# Patient Record
Sex: Male | Born: 1995 | Race: White | Hispanic: No | Marital: Single | Smoking: Never smoker
Health system: Southern US, Community
[De-identification: ages and names within clinical notes are randomized; demographics above are authoritative.]

## PROBLEM LIST (undated history)

## (undated) DIAGNOSIS — F909 Attention-deficit hyperactivity disorder, unspecified type: Secondary | ICD-10-CM

---
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
## 2015-01-18 ENCOUNTER — Emergency Department (HOSPITAL_COMMUNITY): Payer: BLUE CROSS/BLUE SHIELD

## 2015-01-18 ENCOUNTER — Encounter (HOSPITAL_COMMUNITY): Payer: Self-pay | Admitting: *Deleted

## 2015-01-18 ENCOUNTER — Emergency Department (HOSPITAL_COMMUNITY)
Admission: EM | Admit: 2015-01-18 | Discharge: 2015-01-18 | Disposition: A | Discharge status: Home / Self Care | Payer: BLUE CROSS/BLUE SHIELD | Attending: Emergency Medicine | Admitting: Emergency Medicine

## 2015-01-18 DIAGNOSIS — L02415 Cutaneous abscess of right lower limb: Secondary | ICD-10-CM | POA: Diagnosis present

## 2015-01-18 DIAGNOSIS — Z8659 Personal history of other mental and behavioral disorders: Secondary | ICD-10-CM | POA: Diagnosis not present

## 2015-01-18 HISTORY — DX: Attention-deficit hyperactivity disorder, unspecified type: F90.9

## 2015-01-18 LAB — CBC WITH DIFFERENTIAL/PLATELET
Basophils Absolute: 0 10*3/uL (ref 0.0–0.1)
Basophils Relative: 0 % (ref 0–1)
EOS ABS: 0.1 10*3/uL (ref 0.0–0.7)
Eosinophils Relative: 1 % (ref 0–5)
HCT: 44.8 % (ref 39.0–52.0)
HEMOGLOBIN: 15.8 g/dL (ref 13.0–17.0)
LYMPHS ABS: 1.7 10*3/uL (ref 0.7–4.0)
LYMPHS PCT: 16 % (ref 12–46)
MCH: 30.3 pg (ref 26.0–34.0)
MCHC: 35.3 g/dL (ref 30.0–36.0)
MCV: 86 fL (ref 78.0–100.0)
Monocytes Absolute: 1.3 10*3/uL — ABNORMAL HIGH (ref 0.1–1.0)
Monocytes Relative: 12 % (ref 3–12)
NEUTROS ABS: 7.4 10*3/uL (ref 1.7–7.7)
NEUTROS PCT: 71 % (ref 43–77)
PLATELETS: 264 10*3/uL (ref 150–400)
RBC: 5.21 MIL/uL (ref 4.22–5.81)
RDW: 12.7 % (ref 11.5–15.5)
WBC: 10.4 10*3/uL (ref 4.0–10.5)

## 2015-01-18 LAB — BASIC METABOLIC PANEL
Anion gap: 8 (ref 5–15)
BUN: 10 mg/dL (ref 6–23)
CO2: 28 mmol/L (ref 19–32)
Calcium: 9.5 mg/dL (ref 8.4–10.5)
Chloride: 103 mmol/L (ref 96–112)
Creatinine, Ser: 0.82 mg/dL (ref 0.50–1.35)
GFR calc non Af Amer: >90 mL/min (ref >90)
GLUCOSE: 90 mg/dL (ref 70–99)
Potassium: 3.6 mmol/L (ref 3.5–5.1)
Sodium: 139 mmol/L (ref 135–145)

## 2015-01-18 MED ORDER — VANCOMYCIN HCL IN DEXTROSE 1-5 GM/200ML-% IV SOLN
1000.0000 mg | Freq: Once | INTRAVENOUS | Status: AC
  Administered 2015-01-18: 1000 mg via INTRAVENOUS
  Filled 2015-01-18: qty 200

## 2015-01-18 MED ORDER — OXYCODONE-ACETAMINOPHEN 5-325 MG PO TABS
1.0000 | ORAL_TABLET | Freq: Once | ORAL | Status: AC
Start: 2015-01-18 — End: 2015-01-18
  Administered 2015-01-18: 1 via ORAL
  Filled 2015-01-18: qty 1

## 2015-01-18 MED ORDER — LIDOCAINE HCL (PF) 2 % IJ SOLN
10.0000 mL | Freq: Once | INTRAMUSCULAR | Status: AC
  Administered 2015-01-18: 10 mL
  Filled 2015-01-18: qty 10

## 2015-01-18 MED ORDER — IBUPROFEN 800 MG PO TABS: 800.0000 mg | ORAL_TABLET | Freq: Three times a day (TID) | ORAL | Status: AC

## 2015-01-18 MED ORDER — DOXYCYCLINE HYCLATE 100 MG PO CAPS: 100.0000 mg | ORAL_CAPSULE | Freq: Two times a day (BID) | ORAL | Status: AC

## 2015-01-18 MED ORDER — KETOROLAC TROMETHAMINE 10 MG PO TABS
10.0000 mg | ORAL_TABLET | Freq: Once | ORAL | Status: AC
  Administered 2015-01-18: 10 mg via ORAL
  Filled 2015-01-18: qty 1

## 2015-01-18 MED ORDER — PROMETHAZINE HCL 12.5 MG PO TABS
12.5000 mg | ORAL_TABLET | Freq: Once | ORAL | Status: AC
  Administered 2015-01-18: 12.5 mg via ORAL
  Filled 2015-01-18: qty 1

## 2015-01-18 MED ORDER — OXYCODONE-ACETAMINOPHEN 5-325 MG PO TABS: 1.0000 | ORAL_TABLET | Freq: Four times a day (QID) | ORAL | Status: AC | PRN

## 2015-01-18 NOTE — ED Notes (Signed)
Raised red area to rt knee, Pt has it had a "white head and I popped it and pus came out.".

## 2015-01-18 NOTE — ED Notes (Signed)
Iv vancomycin in, Pt called and IV site red, and uncomfortable.  Iv removed.

## 2015-01-18 NOTE — ED Notes (Signed)
Decreased redness at site of IV, Loney LaurenceH Bryant PA had been made aware .  Knee bandaged, and pt ambulated out of ED

## 2015-01-18 NOTE — Discharge Instructions (Signed)
Starting tomorrow, please change her bandage daily until the wound is healed from the inside out. Please soak in a tub of warm Epsom salt water for about 15 minutes daily until the wound is healed. Please take doxycycline 2 times daily with food until all taken. Please use ibuprofen 3 times daily with a meal. May use Percocet every 6 hours if needed for pain. This medication may cause drowsiness, and or constipation. Please use with caution. Abscess Care After An abscess (also called a boil or furuncle) is an infected area that contains a collection of pus. Signs and symptoms of an abscess include pain, tenderness, redness, or hardness, or you may feel a moveable soft area under your skin. An abscess can occur anywhere in the body. The infection may spread to surrounding tissues causing cellulitis. A cut (incision) by the surgeon was made over your abscess and the pus was drained out. Gauze may have been packed into the space to provide a drain that will allow the cavity to heal from the inside outwards. The boil may be painful for 5 to 7 days. Most people with a boil do not have high fevers. Your abscess, if seen early, may not have localized, and may not have been lanced. If not, another appointment may be required for this if it does not get better on its own or with medications. HOME CARE INSTRUCTIONS   Only take over-the-counter or prescription medicines for pain, discomfort, or fever as directed by your caregiver.  When you bathe, soak and then remove gauze or iodoform packs at least daily or as directed by your caregiver. You may then wash the wound gently with mild soapy water. Repack with gauze or do as your caregiver directs. SEEK IMMEDIATE MEDICAL CARE IF:   You develop increased pain, swelling, redness, drainage, or bleeding in the wound site.  You develop signs of generalized infection including muscle aches, chills, fever, or a general ill feeling.  An oral temperature above 102 F  (38.9 C) develops, not controlled by medication. See your caregiver for a recheck if you develop any of the symptoms described above. If medications (antibiotics) were prescribed, take them as directed. Document Released: 05/14/2005 Document Revised: 01/18/2012 Document Reviewed: 01/09/2008 [facility] Patient Information 2015 [employer] [place]. This information is not intended to replace advice given to you by your health care provider. Make sure you discuss any questions you have with your health care provider.

## 2015-01-18 NOTE — ED Provider Notes (Signed)
CSN: [id number]     Arrival date & time 01/18/15  0944 History   First MD Initiated Contact with Patient 01/18/15 1152     Chief Complaint  Patient presents with  . Abscess     (Consider location/radiation/quality/duration/timing/severity/associated sxs/prior Treatment) Patient is a 19 y.o. male presenting with abscess. The history is provided by the patient.  Abscess Location:  Leg Leg abscess location:  R knee Abscess quality: draining, painful, redness and warmth   Duration:  4 days Progression:  Worsening Pain details:    Quality:  Pressure and aching   Severity:  Moderate   Timing:  Intermittent   Progression:  Worsening Chronicity:  New Context: not diabetes and not immunosuppression   Relieved by:  Nothing Worsened by:  Nothing tried Ineffective treatments:  None tried Associated symptoms: fever   Associated symptoms: no fatigue and no nausea   Associated symptoms comment:  Pain with standing and walking   Past Medical History  Diagnosis Date  . ADHD (attention deficit hyperactivity disorder)    History reviewed. No pertinent past surgical history. No family history on file. History  Substance Use Topics  . Smoking status: Never Smoker   . Smokeless tobacco: Not on file  . Alcohol Use: No    Review of Systems  Constitutional: Positive for fever. Negative for fatigue.  Gastrointestinal: Negative for nausea.  Musculoskeletal: Positive for arthralgias.  All other systems reviewed and are negative.     Allergies  Review of patient's allergies indicates no known allergies.  Home Medications   Prior to Admission medications   Not on File   BP 125/65 mmHg  Pulse 94  Temp(Src) 99 F (37.2 C) (Oral)  Resp 16  Ht 5\' 4"  (1.626 m)  Wt 135 lb (61.236 kg)  BMI 23.16 kg/m2  SpO2 100% Physical Exam  Constitutional: He is oriented to person, place, and time. He appears well-developed and well-nourished.  Non-toxic appearance.  HENT:  Head:  Normocephalic.  Right Ear: Tympanic membrane and external ear normal.  Left Ear: Tympanic membrane and external ear normal.  Eyes: EOM and lids are normal. Pupils are equal, round, and reactive to light.  Neck: Normal range of motion. Neck supple. Carotid bruit is not present.  Cardiovascular: Normal rate, regular rhythm, normal heart sounds, intact distal pulses and normal pulses.   Pulmonary/Chest: Breath sounds normal. No respiratory distress.  Abdominal: Soft. Bowel sounds are normal. There is no tenderness. There is no guarding.  Musculoskeletal: Normal range of motion.  Lymphadenopathy:       Head (right side): No submandibular adenopathy present.       Head (left side): No submandibular adenopathy present.    He has no cervical adenopathy.  Neurological: He is alert and oriented to person, place, and time. He has normal strength. No cranial nerve deficit or sensory deficit.  Skin: Skin is warm and dry.  Psychiatric: He has a normal mood and affect. His speech is normal.  Nursing note and vitals reviewed.       ED Course  INCISION AND DRAINAGE Date/Time: 01/18/2015 3:27 PM Performed by: Ivery QualeBRYANT, Audi Conover Authorized by: Ivery QualeBRYANT, Wilhelmenia Addis Consent: Verbal consent obtained. Risks and benefits: risks, benefits and alternatives were discussed Consent given by: patient Patient understanding: patient states understanding of the procedure being performed Patient identity confirmed: arm band Time out: Immediately prior to procedure a "time out" was called to verify the correct patient, procedure, equipment, support staff and site/side marked as required. Type: abscess Body area: lower  extremity (right knee) Anesthesia: local infiltration Local anesthetic: lidocaine 2% without epinephrine Patient sedated: no Scalpel size: 11 Incision type: single straight Complexity: simple Drainage: purulent Drainage amount: moderate Wound treatment: wound left open Patient tolerance: Patient  tolerated the procedure well with no immediate complications   (including critical care time) Labs Review Labs Reviewed - No data to display  Imaging Review No results found.   EKG Interpretation None      MDM  Vital signs stable. PUlse ox 100% on room air. WNL by my interpretation. CBC and Bmet wnl.  Xray neg for gas or osteomyelitis I and D carried out and culture sent to the lab. Pt to return if any signs of infection.  Rx for doxycycline and percocet given to the patient.   Final diagnoses:  None    **I have reviewed nursing notes, vital signs, and all appropriate lab and imaging results for this patient.Ivery Quale, PA-C 01/18/15 1530  Samuel Jester, DO 01/20/15 [phone]

## 2015-01-18 NOTE — ED Notes (Signed)
Pt states abscess to right knee x 4 days.

## 2015-01-21 ENCOUNTER — Telehealth (HOSPITAL_COMMUNITY): Payer: Self-pay

## 2015-01-21 LAB — CULTURE, ROUTINE-ABSCESS

## 2015-01-21 NOTE — ED Notes (Signed)
Lab called with positive wound culture- mrsa. Treated per protocol. Attempting to contact pt.

## 2015-01-22 ENCOUNTER — Telehealth (HOSPITAL_BASED_OUTPATIENT_CLINIC_OR_DEPARTMENT_OTHER): Payer: Self-pay | Admitting: Emergency Medicine

## 2015-01-22 NOTE — Telephone Encounter (Signed)
Post ED Visit - Positive Culture Follow-up  Culture report reviewed by antimicrobial stewardship pharmacist: []  Wes Dulaney, Pharm.D., BCPS [x]  Celedonio MiyamotoJeremy Frens, Pharm.D., BCPS []  Georgina PillionElizabeth Martin, [address]Ph[address] BCPS []  BanksMinh Pham, [place]arm.D., BCPS, AAHIVP []  Estella HuskMichelle Turner, Pharm.D., BCPS, AAHIVP []  Elder CyphersLorie Poole, [address]Pharm[address]D., BCPS  Positive abcess culture MRSA Treated with doxycycline, organism sensitive to the same and no further patient follow-up is required at this time.  Berle MullMiller, Nickie Warwick 01/22/2015, 12:31 PM

## 2015-01-28 ENCOUNTER — Telehealth (HOSPITAL_COMMUNITY): Payer: Self-pay

## 2015-01-28 NOTE — ED Notes (Signed)
Unable to reach by telephone. Letter sent to address on record.  

## 2015-02-05 ENCOUNTER — Telehealth (HOSPITAL_COMMUNITY): Payer: Self-pay

## 2015-02-05 NOTE — ED Notes (Signed)
Unable to contact pt by mail or telephone. Unable to communicate lab results or treatment changes. 

## 2016-02-08 IMAGING — DX DG KNEE COMPLETE 4+V*R*
4 series · 4 of 4 positions shown · non-contrast
Comparison: None.

CLINICAL DATA: Scan lesion anterior laterally with signs of
infection, initial encounter

EXAM:
RIGHT KNEE - COMPLETE 4+ VIEW

[knee ap]
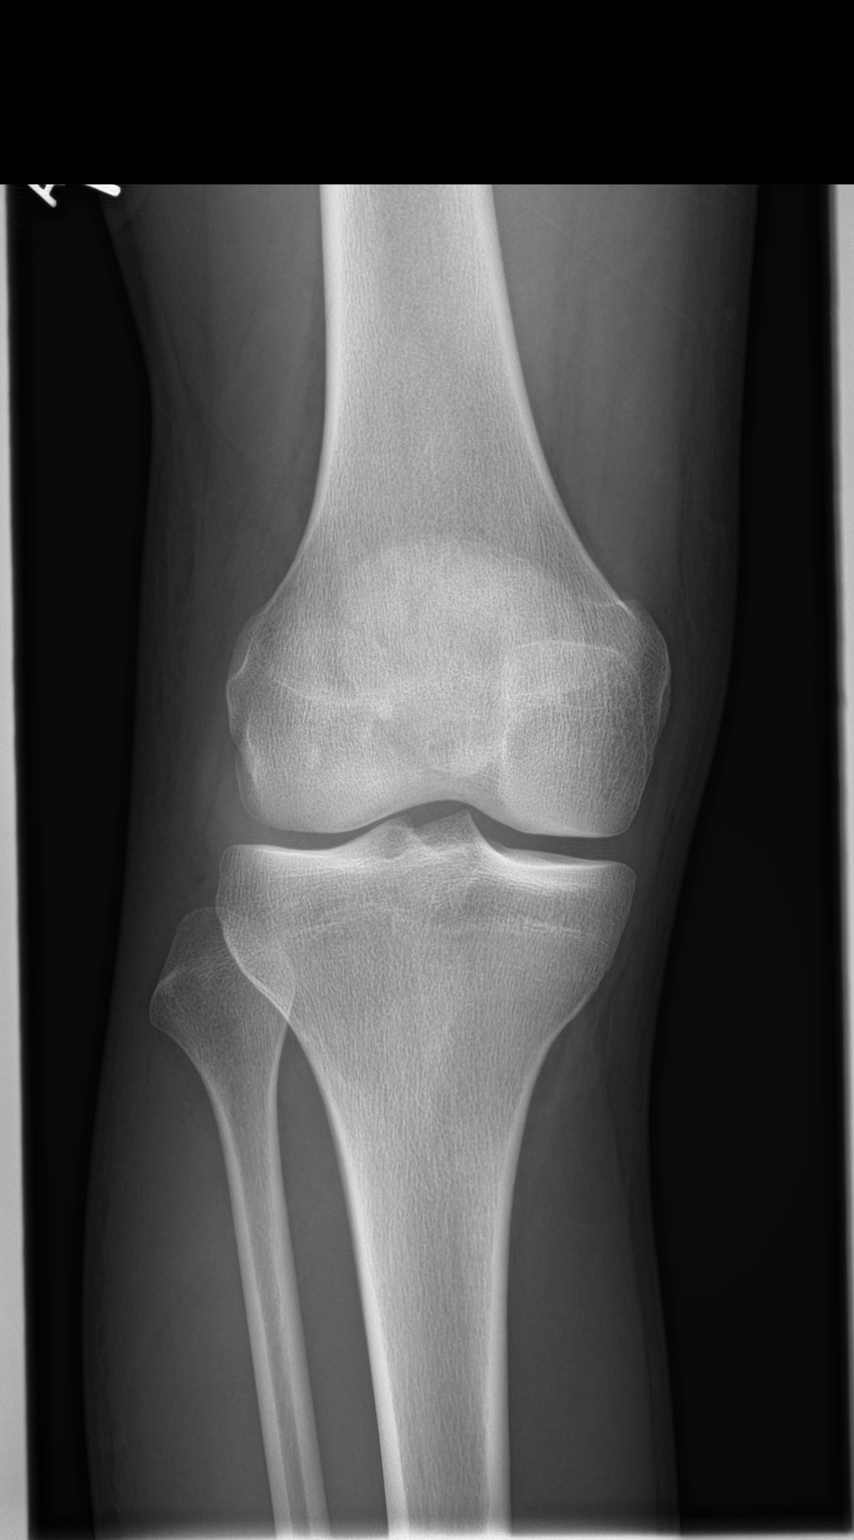

[knee obl (1 of 2)]
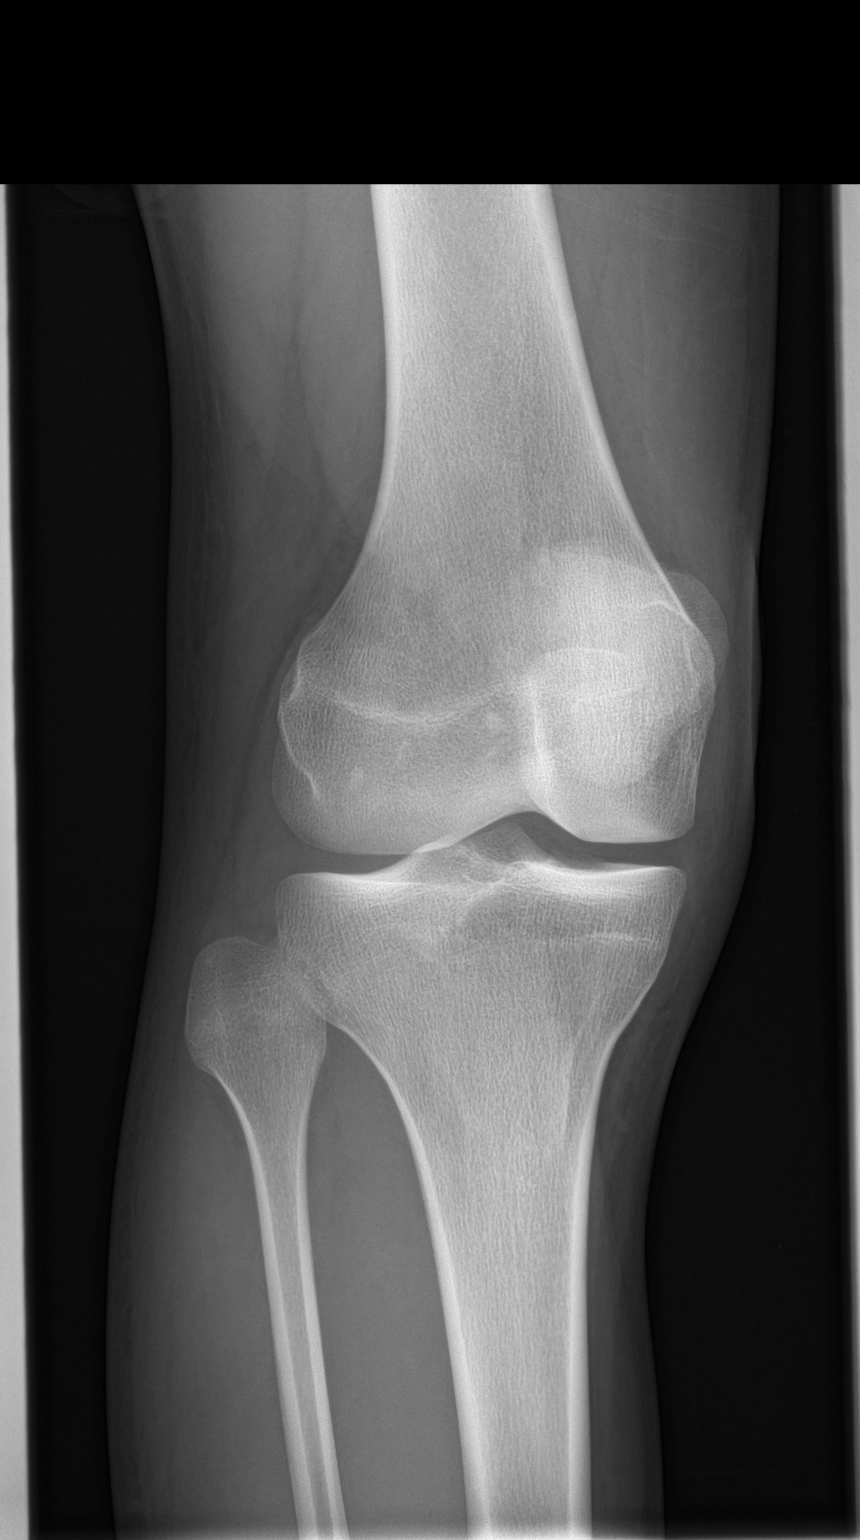

[knee obl (2 of 2)]
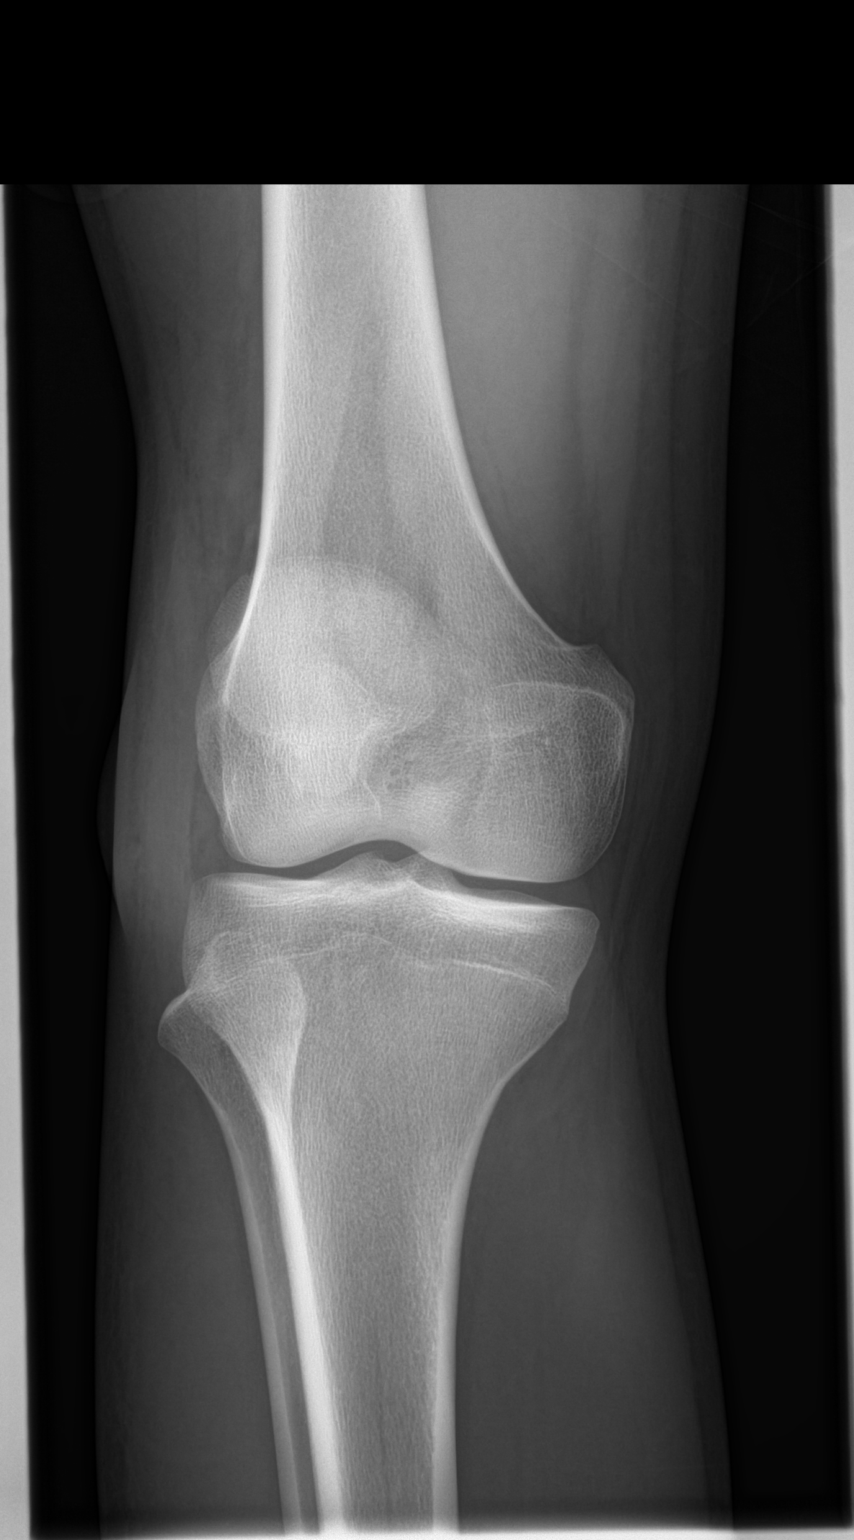

[knee lat]
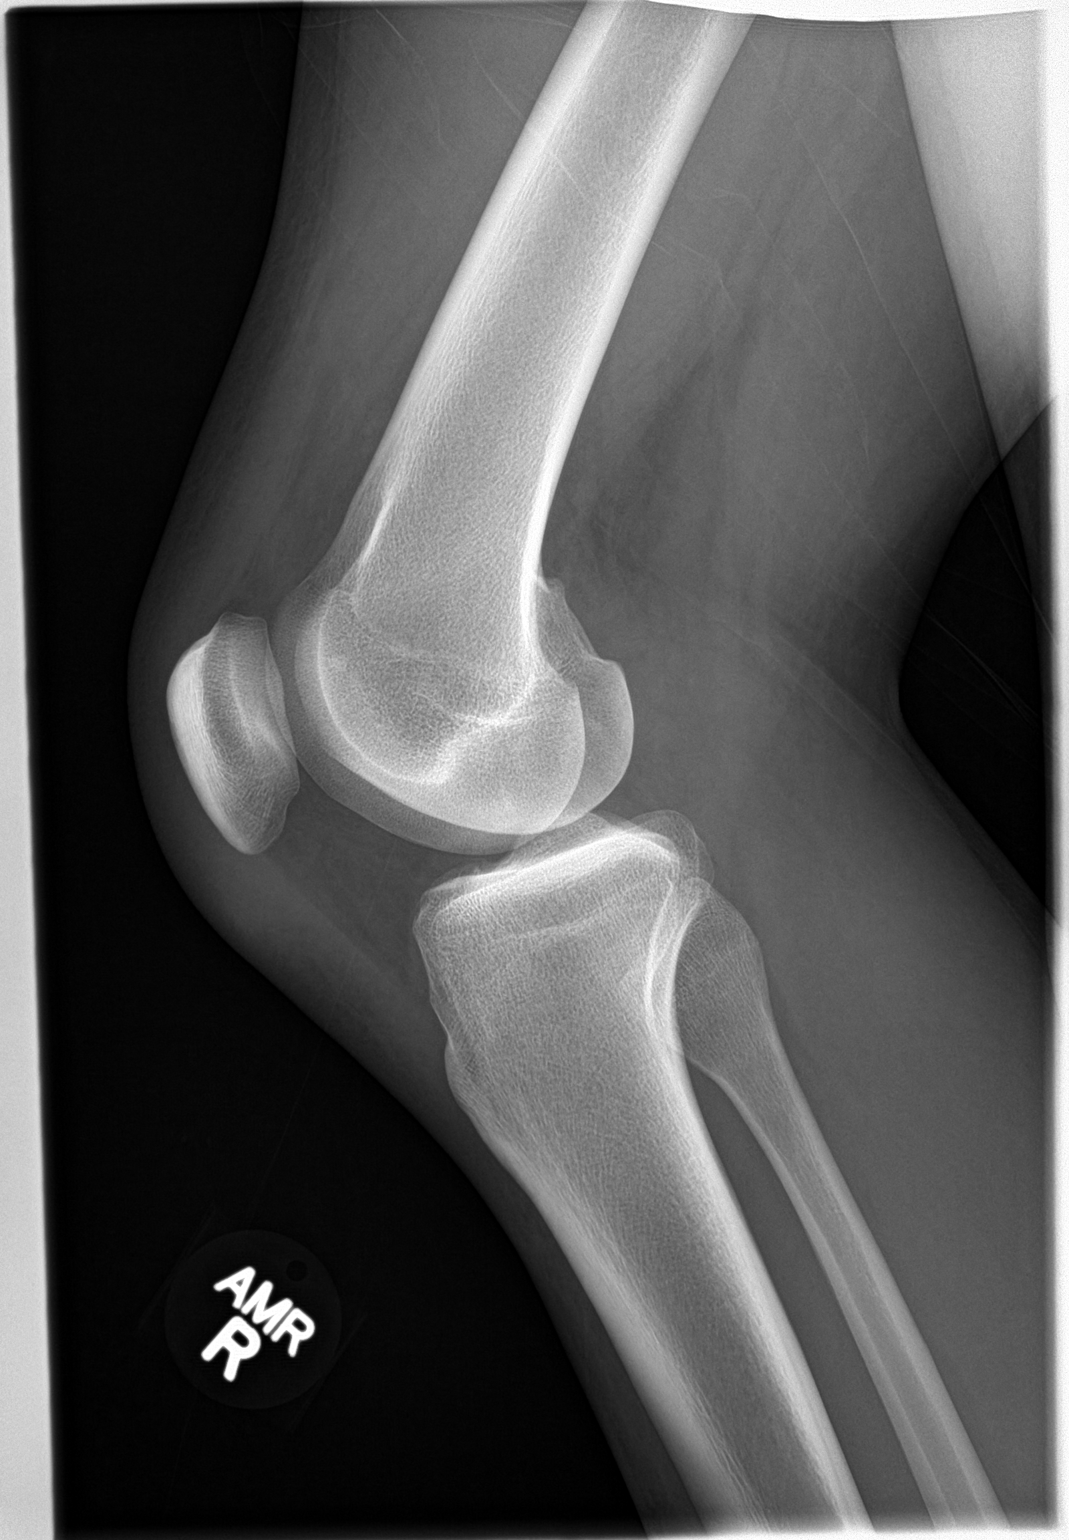

[4 of 4 positions shown; findings below may reference images not displayed]

FINDINGS: Mild soft tissue swelling is noted laterally consistent with the
recent history. No acute bony abnormality is noted.
IMPRESSION: Soft tissue changes without acute bony abnormality.
